# Patient Record
Sex: Male | Born: 2000 | Race: Black or African American | Hispanic: No | Marital: Single | Smoking: Never smoker
Health system: Southern US, Community
[De-identification: ages and names within clinical notes are randomized; demographics above are authoritative.]

## PROBLEM LIST (undated history)

## (undated) DIAGNOSIS — S82892A Other fracture of left lower leg, initial encounter for closed fracture: Secondary | ICD-10-CM

## (undated) DIAGNOSIS — K4090 Unilateral inguinal hernia, without obstruction or gangrene, not specified as recurrent: Secondary | ICD-10-CM

## (undated) DIAGNOSIS — S36113A Laceration of liver, unspecified degree, initial encounter: Secondary | ICD-10-CM

---
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
## 2021-11-29 DIAGNOSIS — X58XXXA Exposure to other specified factors, initial encounter: Secondary | ICD-10-CM | POA: Insufficient documentation

## 2021-11-29 DIAGNOSIS — T17228A Food in pharynx causing other injury, initial encounter: Secondary | ICD-10-CM | POA: Diagnosis not present

## 2021-11-30 ENCOUNTER — Emergency Department: Payer: 59

## 2021-11-30 ENCOUNTER — Encounter: Payer: Self-pay | Admitting: Emergency Medicine

## 2021-11-30 ENCOUNTER — Emergency Department
Admission: EM | Admit: 2021-11-30 | Discharge: 2021-11-30 | Disposition: A | Discharge status: Home / Self Care | Payer: 59 | Attending: Emergency Medicine | Admitting: Emergency Medicine

## 2021-11-30 ENCOUNTER — Other Ambulatory Visit: Payer: Self-pay

## 2021-11-30 DIAGNOSIS — T17228A Food in pharynx causing other injury, initial encounter: Secondary | ICD-10-CM | POA: Diagnosis not present

## 2021-11-30 DIAGNOSIS — R0989 Other specified symptoms and signs involving the circulatory and respiratory systems: Secondary | ICD-10-CM

## 2021-11-30 NOTE — ED Notes (Signed)
Pt discharge information reviewed. Pt understands need for follow up care and when to return if symptoms worsen. All questions answered. Pt is alert and oriented with even and regular respirations. Pt is seen ambulating out of department with string steady gait.   

## 2021-11-30 NOTE — ED Triage Notes (Signed)
  Patient comes in with throat pain and states he swallowed a piece of a chicken bone around 2100.  Patient endorses dysphagia and states he can still feel it in his throat.  Pain 1/10, when swallowing.  No respiratory distress.

## 2021-11-30 NOTE — ED Provider Notes (Signed)
[facility] Provider Note    Event Date/Time   First MD Initiated Contact with Patient 11/30/21 0145     (approximate)   History   Swallowed Foreign Body   HPI  Adam Shepard is a 21 y.o. male who presents to the ED for evaluation of Swallowed Foreign Body   Mother brings patient to the ED for evaluation of a possible esophageal foreign body.  They report that he was eating chicken wings this evening when he accidentally ate the cartilaginous portion on the end of 1 of these wings.  He denies eating any bones, and confirms that he just accidentally ate the approximately 1 cm in diameter circular cartilage and on one of the wings.  He reports feeling like he got stuck in the left side of his throat about midway down, then the sensation transition to more midline.  He says he feels a scratching sensation like it might still be stuck in there.  Denies any coughing, choking, drooling.  He is phonating with a normal voice per his mother.  Has had no difficulty swallowing water or bread after this, but reports a persistent mild scratching sensation.  Physical Exam   Triage Vital Signs: ED Triage Vitals [11/30/21 0008]  Enc Vitals Group     BP 137/86     Pulse Rate (!) 53     Resp 16     Temp 98 F (36.7 C)     Temp Source Oral     SpO2 99 %     Weight 195 lb (88.5 kg)     Height 6\' 1"  (1.854 m)     Head Circumference      Peak Flow      Pain Score 1     Pain Loc      Pain Edu?      Excl. in GC?     Most recent vital signs: Vitals:   11/30/21 0208 11/30/21 0209  BP: (!) 145/83   Pulse:  (!) 51  Resp:    Temp:    SpO2:  100%    General: Awake, no distress.  Pleasant and conversational in full sentences.  MMM.  Can open his mouth widely, uvula is midline without apparent derangements intraorally or signs of trauma to the posterior oropharynx. CV:  Good peripheral perfusion.  Resp:  Normal effort.  Abd:  No distention.  MSK:  No deformity  noted.  Neuro:  No focal deficits appreciated. Other:  No neck crepitus.    ED Results / Procedures / Treatments   Labs (all labs ordered are listed, but only abnormal results are displayed) Labs Reviewed - No data to display  EKG   RADIOLOGY Plain film of the soft tissue neck interpreted by me without evidence of radiopaque foreign body  Official radiology report(s): DG Neck Soft Tissue  Result Date: 11/30/2021 CLINICAL DATA:  Foreign body (chicken bone) EXAM: NECK SOFT TISSUES - 1+ VIEW COMPARISON:  None Available. FINDINGS: There is no evidence of retropharyngeal soft tissue swelling or epiglottic enlargement. The cervical airway is unremarkable and no radio-opaque foreign body identified. IMPRESSION: Negative.  No radiopaque foreign body is seen. Electronically Signed   By: 12/02/2021 M.D.   On: 11/30/2021 00:44    PROCEDURES and INTERVENTIONS:  Procedures  Medications - No data to display   IMPRESSION / MDM / ASSESSMENT AND PLAN / ED COURSE  I reviewed the triage vital signs and the nursing notes.  Differential diagnosis includes,  but is not limited to, esophageal foreign body, esophageal perforation, esophageal abrasion, aspiration  {Patient presents with symptoms of an acute illness or injury that is potentially life-threatening.  21 year old male presents to the ED with a scratching/foreign body sensation to his throat after swallowing a cartilaginous portion of a chicken wing.  No evidence of upper airway obstruction or indications for emergent endoscopy.  He is tolerating his secretions.  X-ray without evidence of foreign body.  Discussed with patient and mother that he may have a small retained foreign body but more likely has a an abrasion from swallowing this small piece of cartilage.  Should be able to pass this object.  I see no indications for emergent GI evaluation.  I provided him with GI follow-up information and we discussed if his symptoms do not  improve after a couple days to reach out to them for the clinic visit.  We discussed return precautions for the ED in the meantime.      FINAL CLINICAL IMPRESSION(S) / ED DIAGNOSES   Final diagnoses:  Foreign body sensation in throat     Rx / DC Orders   ED Discharge Orders     None        Note:  This document was prepared using Dragon voice recognition software and may include unintentional dictation errors.   Delton Prairie, MD 11/30/21 [phone]

## 2021-11-30 NOTE — Discharge Instructions (Signed)
As we discussed, it is possible that the chunk of chicken cartilage scratched her throat on the way down to cause your current sensation.  If you still feel the sensation by Tuesday, when clinic opens up after the holiday, then please reach out to the GI doctor to be seen in the clinic to discuss having an endoscopy performed.  If you develop any further worsening symptoms before then such as difficulty swallowing, breathing or sudden severe pain or fevers then please return to the ED.

## 2022-07-17 ENCOUNTER — Encounter: Payer: Self-pay | Admitting: Nurse Practitioner

## 2022-07-17 ENCOUNTER — Ambulatory Visit: Payer: Self-pay | Admitting: Nurse Practitioner

## 2022-07-17 DIAGNOSIS — Z202 Contact with and (suspected) exposure to infections with a predominantly sexual mode of transmission: Secondary | ICD-10-CM

## 2022-07-17 DIAGNOSIS — Z113 Encounter for screening for infections with a predominantly sexual mode of transmission: Secondary | ICD-10-CM

## 2022-07-17 LAB — HM HIV SCREENING LAB: HM HIV Screening: NEGATIVE

## 2022-07-17 LAB — HM HEPATITIS C SCREENING LAB: HM Hepatitis Screen: NEGATIVE

## 2022-07-17 MED ORDER — DOXYCYCLINE HYCLATE 100 MG PO TABS: 100.0000 mg | ORAL_TABLET | Freq: Two times a day (BID) | ORAL | 0 refills | Status: DC

## 2022-07-17 NOTE — Progress Notes (Signed)
Pt is here for STD screening and a possible contact to Chlamydia.  The patient was dispensed Doxycycline 100 mg #14 today. I provided counseling today regarding the medication. We discussed the medication, the side effects and when to call clinic. Patient given the opportunity to ask questions. Questions answered.  Pt declined condoms.  Windle Guard, RN

## 2022-07-17 NOTE — Progress Notes (Signed)
[facility] Department STI clinic/screening visit  Subjective:  Adam Shepard is a 22 y.o. male being seen today for an STI screening visit. The patient reports they do not have symptoms.    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  Possible exposure to Chlamydia    HPI  Patient reports to clinic today for STD screening.  Patient reports that he is a contact to Chlamydia.   Last HIV test per patient/review of record was: never   Does the patient or their partner desires a pregnancy in the next year? No  Screening for MPX risk: Does the patient have an unexplained rash? No Is the patient MSM? No Does the patient endorse multiple sex partners or anonymous sex partners? No Did the patient have close or sexual contact with a person diagnosed with MPX? No Has the patient traveled outside the [place] where MPX is endemic? No Is there a high clinical suspicion for MPX-- evidenced by one of the following No  -Unlikely to be chickenpox  -Lymphadenopathy  -Rash that present in same phase of evolution on any given body part   See flowsheet for further details and programmatic requirements.    There is no immunization history on file for this patient.   The following portions of the patient's history were reviewed and updated as appropriate: allergies, current medications, past medical history, past social history, past surgical history and problem list.  Objective:  There were no vitals filed for this visit.  Physical Exam Constitutional:      Appearance: Normal appearance.  HENT:     Head: Normocephalic and atraumatic. No abrasion, masses or laceration. Hair is normal.     Right Ear: External ear normal.      Left Ear: External ear normal.     Nose: Nose normal.     Mouth/Throat:     Lips: Pink.     Mouth: Mucous membranes are moist. No oral lesions.     Pharynx: No pharyngeal swelling, oropharyngeal exudate, posterior oropharyngeal erythema or uvula swelling.     Tonsils: No tonsillar exudate or tonsillar abscesses.  Eyes:     General: Lids are normal.        Right eye: No discharge.        Left eye: No discharge.     Conjunctiva/sclera: Conjunctivae normal.     Right eye: No exudate.    Left eye: No exudate. Pulmonary:     Effort: Pulmonary effort is normal.  Abdominal:     General: Abdomen is flat.     Palpations: Abdomen is soft.     Tenderness: There is no abdominal tenderness. There is no rebound.  Genitourinary:    Pubic Area: No rash or pubic lice.      Penis: Normal and circumcised. No erythema or discharge.      Testes: Normal.        Right: Mass or tenderness not present.        Left: Mass or tenderness not present.     Rectum: Normal.     Comments: Discharge amount: none Color: none  Musculoskeletal:     Cervical back: Full passive range of motion without pain, normal range of motion and neck  supple.  Lymphadenopathy:     Cervical: No cervical adenopathy.     Right cervical: No superficial, deep or posterior cervical adenopathy.    Left cervical: No superficial, deep or posterior cervical adenopathy.     Upper Body:     Right upper body: No supraclavicular, axillary or epitrochlear adenopathy.     Left upper body: No supraclavicular, axillary or epitrochlear adenopathy.     Lower Body: No right inguinal adenopathy. No left inguinal adenopathy.  Skin:    General: Skin is warm and dry.     Findings: No lesion or rash.  Neurological:     Mental Status: He is alert and oriented to person, place, and time.  Psychiatric:        Attention and Perception: Attention normal.        Mood and Affect: Mood normal.        Behavior: Behavior normal. Behavior is cooperative.        Assessment and Plan:  Adam Shepard is a 22 y.o. male presenting to the [facility] Department for STI screening  1. Screening examination for venereal disease -22 year old male in clinic today for STD screening. -Patient does not have STI symptoms Patient accepted all screenings including urine CT/GC and bloodwork for HIV/RPR.  Patient meets criteria for HepB screening? No. Ordered? No - low risk  Patient meets criteria for HepC screening? Yes. Ordered? Yes Recommended condom use with all sex Discussed importance of condom use for STI prevent  Discussed time line for State Lab results and that patient will be called with positive results and encouraged patient to call if he had not heard in 2 weeks Recommended returning for continued or worsening symptoms.    2. Exposure to chlamydia -Treat patient today as a contact to Chlamydia.  Advised no sex for 7 days and 7 days after partner is treated.   - doxycycline (VIBRA-TABS) 100 MG tablet; Take 1 tablet (100 mg total) by mouth 2 (two) times daily. (Patient not taking: Reported on 07/17/2022)  Dispense: 14 tablet; Refill: 0   Total time spent: 20 minutes   Return if symptoms worsen or fail to improve.   Gregary Cromer, FNP

## 2022-07-25 LAB — C. TRACHOMATIS NAA, CONFIRM: C. trachomatis NAA, Confirm: POSITIVE — AB

## 2022-07-25 LAB — CHLAMYDIA/GC NAA, CONFIRMATION
Chlamydia trachomatis, NAA: POSITIVE — AB
Neisseria gonorrhoeae, NAA: NEGATIVE

## 2022-07-27 ENCOUNTER — Telehealth: Payer: Self-pay

## 2022-07-27 NOTE — Telephone Encounter (Signed)
Pt returned call and confirmed password. Counseled pt re TR, tx already dispensed during office visit, tx for partners, and TOC. Pt expressed understanding.

## 2022-07-27 NOTE — Telephone Encounter (Signed)
Phone call to pt at [phone]. Left voicemail message that RN with [facility] is calling re TR. Please call Logen Fowle at [phone]. Tried twice.

## 2022-07-27 NOTE — Telephone Encounter (Signed)
Calling pt re positive chlamydia result from 07/17/22 urine specimen. Pt already treated at 07/17/22 visit due to contact to CT. Inform pt of results, partners need tx, TOC in ~ 3 months.

## 2023-11-05 ENCOUNTER — Ambulatory Visit: Admitting: Nurse Practitioner

## 2023-11-05 ENCOUNTER — Encounter: Payer: Self-pay | Admitting: Nurse Practitioner

## 2023-11-05 DIAGNOSIS — Z113 Encounter for screening for infections with a predominantly sexual mode of transmission: Secondary | ICD-10-CM

## 2023-11-05 DIAGNOSIS — Z202 Contact with and (suspected) exposure to infections with a predominantly sexual mode of transmission: Secondary | ICD-10-CM

## 2023-11-05 LAB — HM HEPATITIS C SCREENING LAB: HM Hepatitis Screen: NEGATIVE

## 2023-11-05 LAB — HM HIV SCREENING LAB: HM HIV Screening: NEGATIVE

## 2023-11-05 MED ORDER — DOXYCYCLINE HYCLATE 100 MG PO TABS: 100.0000 mg | ORAL_TABLET | Freq: Two times a day (BID) | ORAL | Status: AC

## 2023-11-05 NOTE — Progress Notes (Signed)
 Pt is here STD visit and report being contact to Chlamydia. Patient was dispensed doxycycline  2x/day for 7 days today . I provided counseling today regarding the medication. We discussed the medication, the side effects and when to call clinic. Patient given the opportunity to ask questions for any clarification. Condoms given. Austine Lefort, RN.

## 2023-11-05 NOTE — Progress Notes (Signed)
 [facility] Department STI clinic [address], [address] [place] [address] Main phone: [phone]  STI screening visit  Subjective:  Adam Shepard is a 23 y.o. male being seen today for an STI screening visit. The patient reports they do not have symptoms.    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 male who presents to the office today requesting treatment as a contact to chlamydia and asymptomatic STI testing.   He reports 2 male partners in the last 2 months, and practices penile/vaginal penetrative sex. Patient reports using condoms sometimes. Patient indicates a history of chlamydia in 2024. He reports last sex was about 1 to 1.5 months ago.    STI screening history: Last HIV test per patient/review of record was  Lab Results  Component Value Date   HMHIVSCREEN Negative - Validated 07/17/2022    Last HEPC test per patient/review of record was  Lab Results  Component Value Date   HMHEPCSCREEN Negative-Validated 07/17/2022    Last HEPB test per patient/review of record was No components found for: "HMHEPBSCREEN"   Fertility: Does the patient or their partner desires a pregnancy in the next year? No  Screening for MPX risk: Does the patient have an unexplained rash? No Is the patient MSM? No Does the patient endorse multiple sex partners or anonymous sex partners? Yes Did the patient have close or sexual contact with a person diagnosed with MPX? No Has the patient traveled outside the [place]  where MPX is endemic? No Is there a high clinical suspicion for MPX-- evidenced by one of the following  No  -Unlikely to be chickenpox  -Lymphadenopathy  -Rash that present in same phase of evolution on any given body part   See flowsheet for further details and programmatic requirements.    There is no immunization history on file for this patient.   The following portions of the patient's history were reviewed and updated as appropriate: allergies, current medications, past medical history, past social history, past surgical history and problem list.  Objective:  There were no vitals filed for this visit.  Physical Exam Nursing note reviewed. Chaperone present: Declined.  Constitutional:      Appearance: Normal appearance.  HENT:     Head: Normocephalic.     Salivary Glands: Right salivary gland is not diffusely enlarged or tender. Left salivary gland is not diffusely enlarged or tender.     Mouth/Throat:     Lips: Pink. No lesions.     Mouth: Mucous membranes are moist. No oral lesions.     Tongue: No lesions. Tongue does not deviate from midline.     Pharynx: Oropharynx is clear. Uvula midline. No oropharyngeal exudate or posterior oropharyngeal erythema.     Tonsils: No tonsillar exudate.  Eyes:     General:        Right eye: No discharge.        Left eye: No discharge.     Conjunctiva/sclera:     Right eye: Right conjunctiva is not injected. No exudate.    Left eye: Left conjunctiva is not injected. No exudate. Pulmonary:  Effort: Pulmonary effort is normal.  Genitourinary:    Comments: Patient asymptomatic and declines genital exam.  Lymphadenopathy:     Head:     Right side of head: No submental, submandibular, tonsillar, preauricular or posterior auricular adenopathy.     Left side of head: No submental, submandibular, tonsillar, preauricular or posterior auricular adenopathy.     Cervical: No cervical adenopathy.     Right cervical: No superficial or posterior cervical adenopathy.    Left cervical: No superficial or posterior cervical adenopathy.     Upper  Body:     Right upper body: No supraclavicular or axillary adenopathy.     Left upper body: No supraclavicular or axillary adenopathy.  Skin:    General: Skin is warm and dry.     Findings: No lesion or rash.     Comments: Skin tone appropriate for ethnicity. Assessed exposed areas and back only.   Neurological:     Mental Status: He is alert and oriented to person, place, and time.  Psychiatric:        Attention and Perception: Attention and perception normal.        Mood and Affect: Mood and affect normal.        Speech: Speech normal.        Behavior: Behavior normal. Behavior is cooperative.        Thought Content: Thought content normal.     Assessment and Plan:  Adam Shepard is a 23 y.o. male presenting to the [facility] Department for STI screening  1. Screening for venereal disease (Primary)  - HBV Antigen/Antibody State Lab - HIV/HCV [place] [facility] - Chlamydia/GC NAA, Confirmation - Syphilis Serology, [facility]  2. Exposure to chlamydia Educated patient on possible medication side effects. Advised patient to avoid any sex (oral or penetrative), even with condoms, for 7 days past his treatment and 7 days past partner treatment. He states he believes the other person was tested and treated but he is not sure 100%. States he has not had sex with them in the last 1-1.5 months and does not plan to.   - doxycycline  (VIBRA -TABS) 100 MG tablet; Take 1 tablet (100 mg total) by mouth 2 (two) times daily for 7 days.  Patient does not have STI symptoms Patient accepted all screenings including  urine GC/Chlamydia, and blood work for HIV/Syphilis. Patient meets criteria for HepB screening? Yes. Ordered? yes Patient meets criteria for HepC screening? Yes. Ordered? yes Recommended condom use with all sex Discussed importance of condom use for STI prevention  Treat positive test results per standing order. Discussed time line for State Lab results and that patient  will be called with positive results and encouraged patient to call if he had not heard in 2 weeks Recommended repeat testing in 3 months with positive results. Recommended returning for continued or worsening symptoms.   Return if symptoms worsen or fail to improve.  No future appointments.  Total time with patient 20 minutes.   Merleen Stare, NP

## 2023-11-10 LAB — C. TRACHOMATIS NAA, CONFIRM: C. trachomatis NAA, Confirm: POSITIVE — AB

## 2023-11-10 LAB — HBV ANTIGEN/ANTIBODY STATE LAB
Hep B Core Total Ab: NONREACTIVE
Hep B S Ab: NONREACTIVE
Hepatitis B Surface Antigen: NONREACTIVE

## 2023-11-10 LAB — CHLAMYDIA/GC NAA, CONFIRMATION
Chlamydia trachomatis, NAA: POSITIVE — AB
Neisseria gonorrhoeae, NAA: NEGATIVE

## 2023-11-11 ENCOUNTER — Telehealth: Payer: Self-pay

## 2023-11-11 NOTE — Telephone Encounter (Signed)
 Phone call to pt at [phone]. Pt answered and confirmed password. Counseled pt regarding + TR and tx already received.  Pt states no issues with medication. Counseled to RTC for TOC in ~ 3 months.

## 2023-11-11 NOTE — Telephone Encounter (Signed)
 Call pt re positive chlamydia result from 11/05/23 urine specimen. Tx already received 11/05/23 during visit due to contact to CT.  TOC ~ 3 months

## 2023-11-17 ENCOUNTER — Encounter: Payer: Self-pay | Admitting: Nurse Practitioner

## 2023-12-04 IMAGING — CR DG NECK SOFT TISSUE
2 series · 2 of 2 positions shown · non-contrast
Comparison: None Available.

CLINICAL DATA: Foreign body (chicken bone)

EXAM:
NECK SOFT TISSUES - 1+ VIEW

[neck lat]
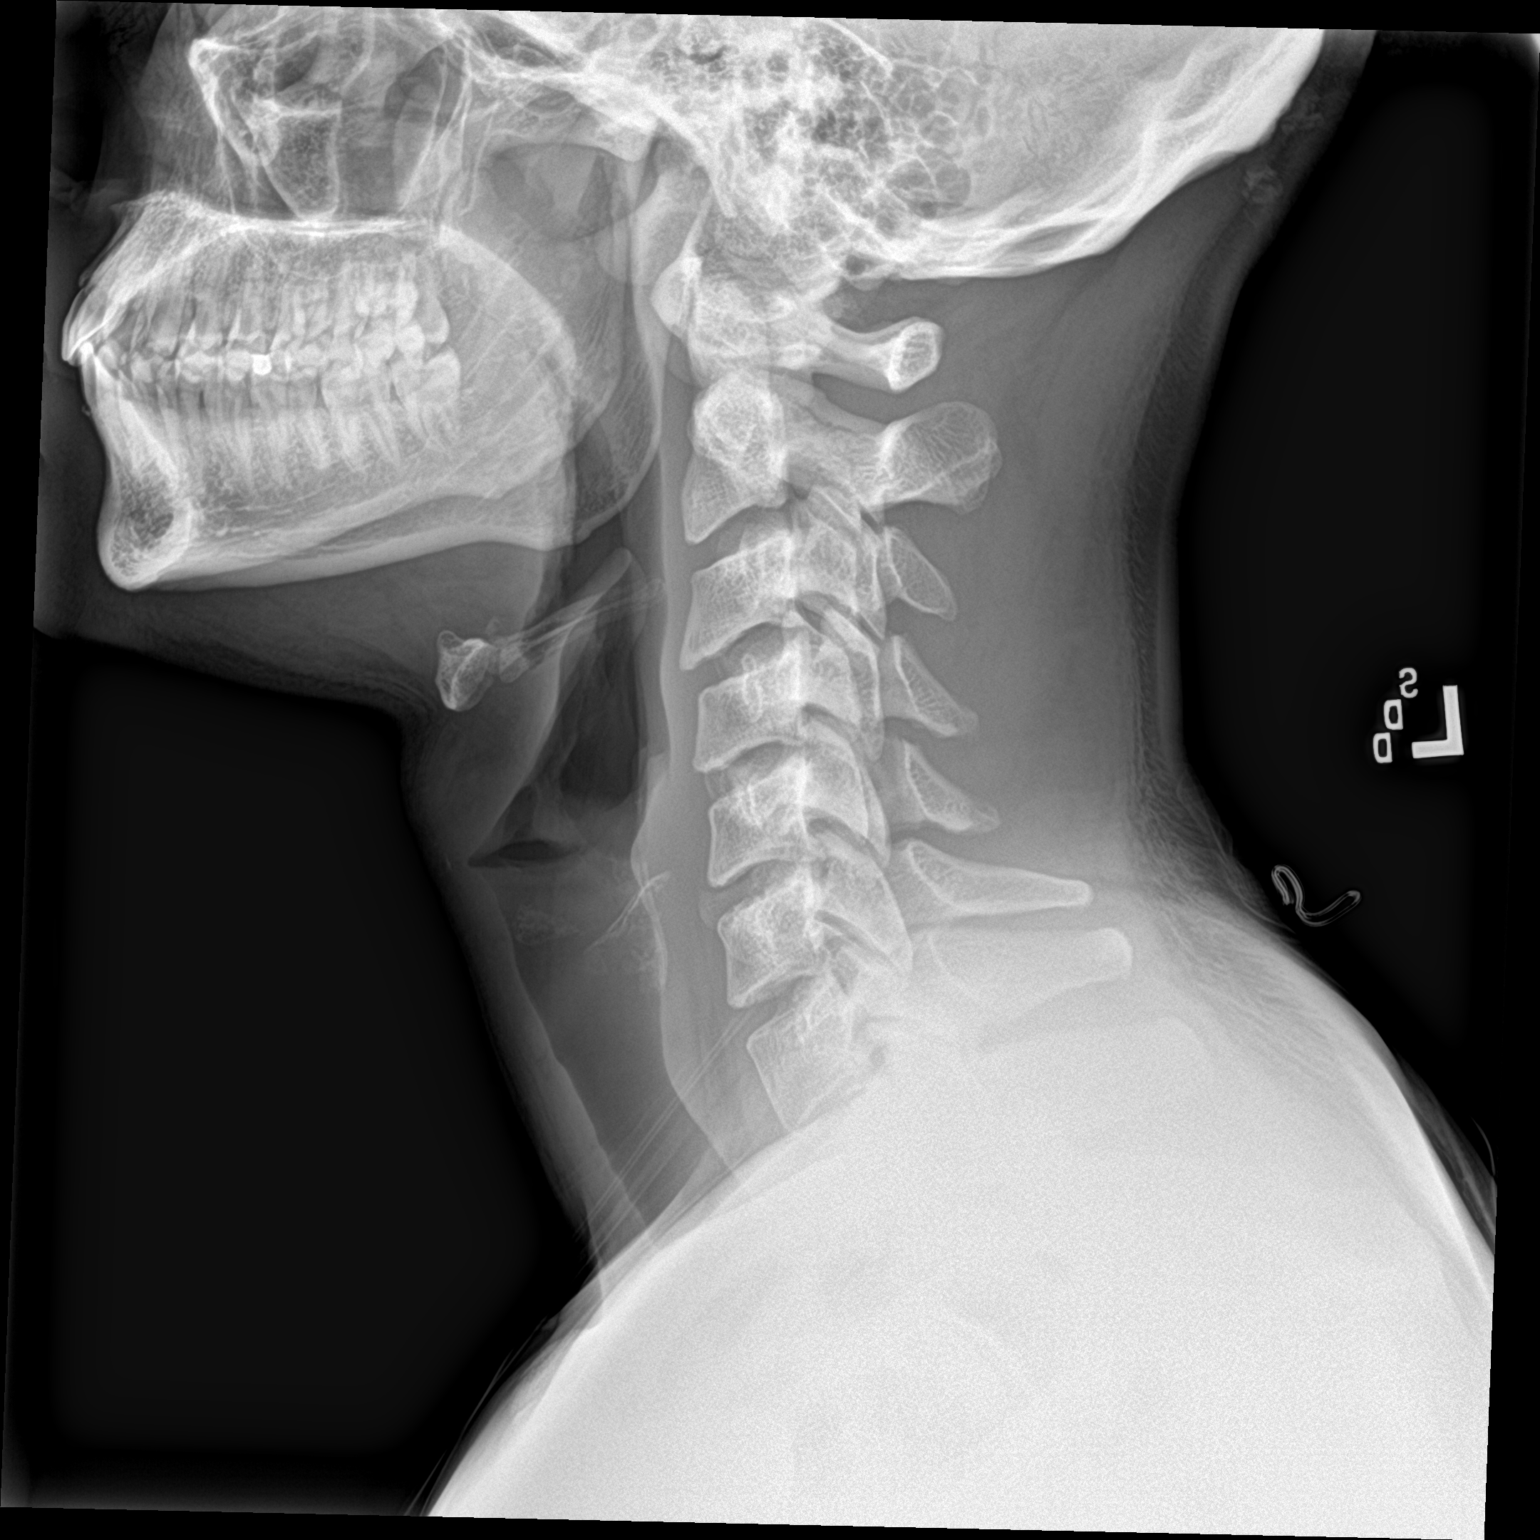

[neck ap]
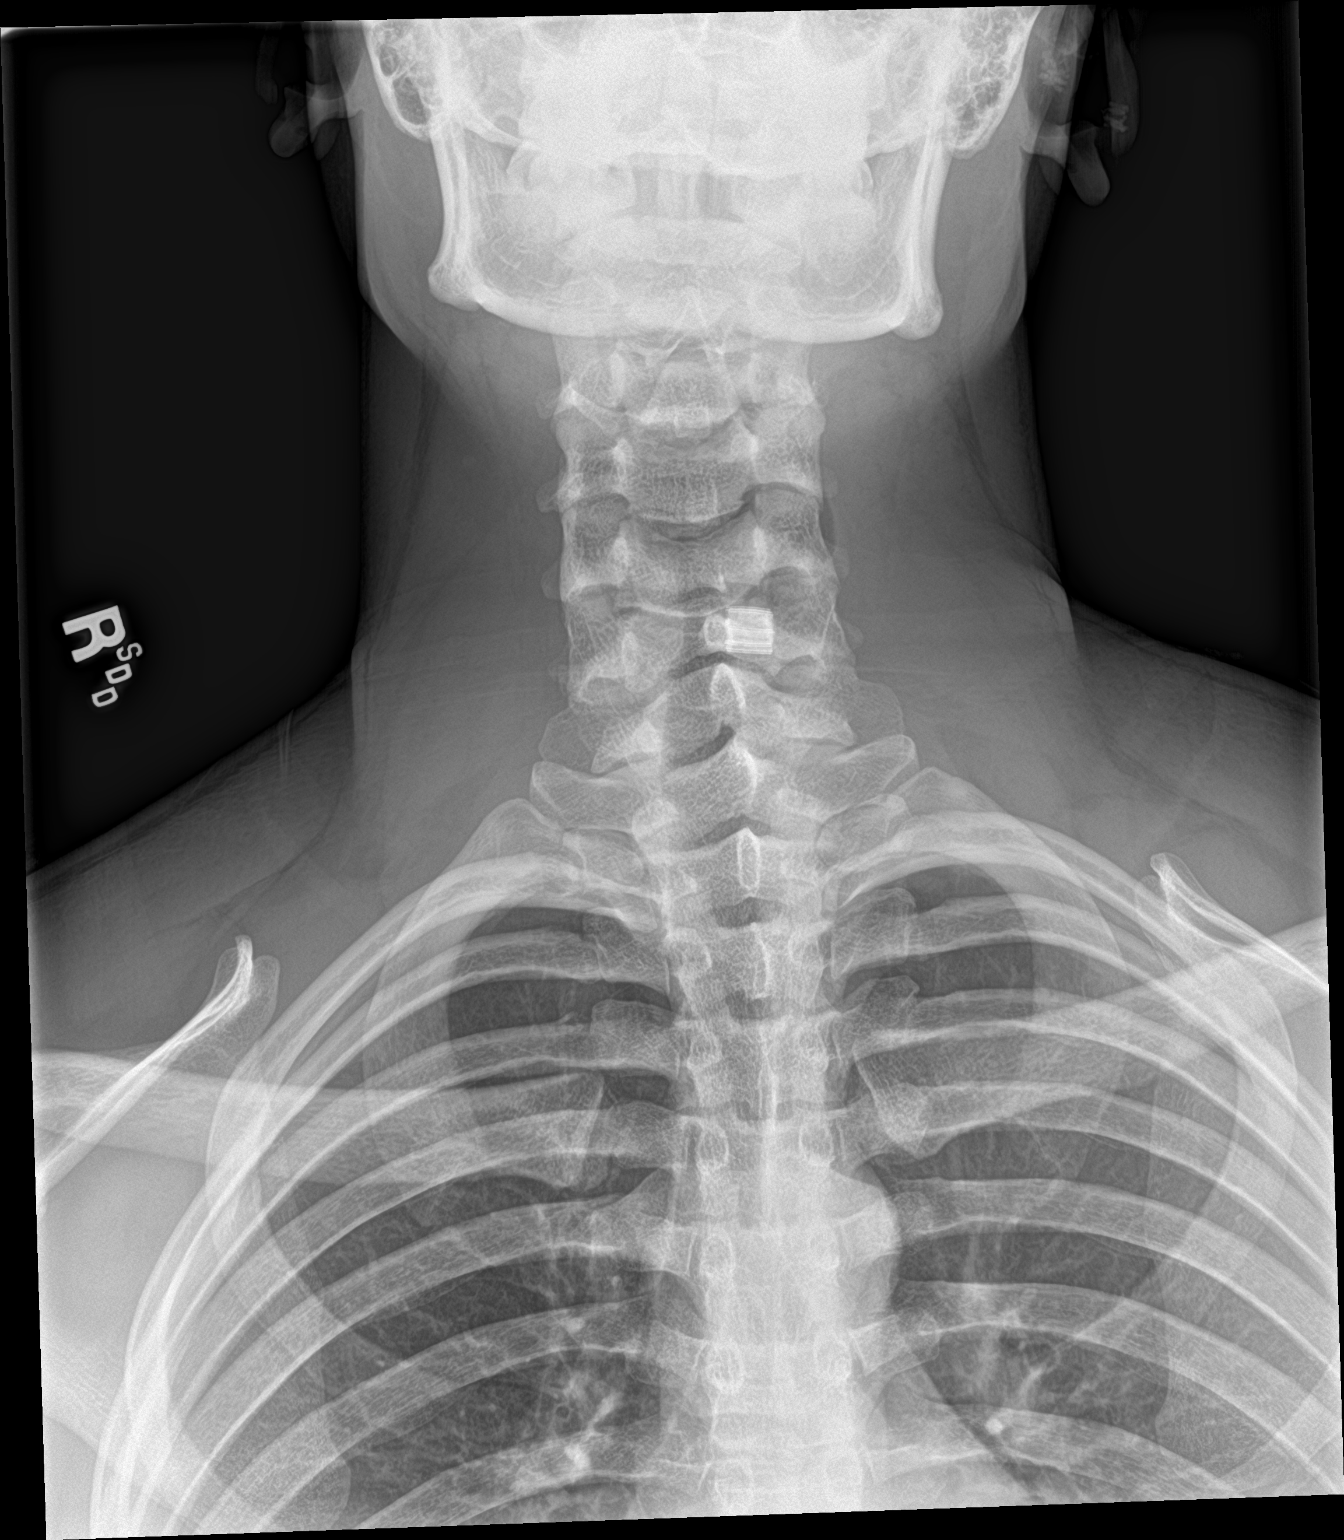

[2 of 2 positions shown; findings below may reference images not displayed]

FINDINGS: There is no evidence of retropharyngeal soft tissue swelling or
epiglottic enlargement. The cervical airway is unremarkable and no
radio-opaque foreign body identified.
IMPRESSION: Negative.  No radiopaque foreign body is seen.
